# Patient Record
Sex: Female | Born: 1955 | Race: Black or African American | Hispanic: No | Marital: Married | Smoking: Former smoker
Health system: Southern US, Community
[De-identification: ages and names within clinical notes are randomized; demographics above are authoritative.]

---

## 1999-09-27 ENCOUNTER — Other Ambulatory Visit: Admission: RE | Admit: 1999-09-27 | Discharge: 1999-09-27 | Payer: Self-pay | Admitting: *Deleted

## 2000-11-09 ENCOUNTER — Ambulatory Visit (HOSPITAL_COMMUNITY): Admission: RE | Admit: 2000-11-09 | Discharge: 2000-11-09 | Payer: Self-pay | Admitting: Obstetrics and Gynecology

## 2000-11-09 ENCOUNTER — Encounter: Payer: Self-pay | Admitting: Obstetrics and Gynecology

## 2002-01-19 ENCOUNTER — Other Ambulatory Visit: Admission: RE | Admit: 2002-01-19 | Discharge: 2002-01-19 | Payer: Self-pay | Admitting: Obstetrics and Gynecology

## 2003-03-31 ENCOUNTER — Other Ambulatory Visit: Admission: RE | Admit: 2003-03-31 | Discharge: 2003-03-31 | Payer: Self-pay | Admitting: Obstetrics and Gynecology

## 2004-12-05 ENCOUNTER — Ambulatory Visit (HOSPITAL_COMMUNITY): Admission: RE | Admit: 2004-12-05 | Discharge: 2004-12-05 | Payer: Self-pay | Admitting: Obstetrics and Gynecology

## 2009-10-04 ENCOUNTER — Ambulatory Visit (HOSPITAL_COMMUNITY): Admission: RE | Admit: 2009-10-04 | Discharge: 2009-10-04 | Payer: Self-pay | Admitting: Obstetrics and Gynecology

## 2010-04-01 ENCOUNTER — Encounter (INDEPENDENT_AMBULATORY_CARE_PROVIDER_SITE_OTHER): Payer: Self-pay | Admitting: *Deleted

## 2010-09-24 NOTE — Letter (Signed)
Summary: New Patient letter  [facility]  [address] [place], [place] [address]   Phone: [phone]  Fax: [phone]       04/01/2010 MRN: [id number]  Alexandria Buchanan [address] [place], [place]  [address]  Dear Alexandria Buchanan,  Welcome to the Gastroenterology Division at [facility].    You are scheduled to see Dr.  Juanda Chance on 06-18-10 at 2:45p.m. on the 3rd floor at [facility], [address].  We ask that you try to arrive at our office 15 minutes prior to your appointment time to allow for check-in.  We would like you to complete the enclosed self-administered evaluation form prior to your visit and bring it with you on the day of your appointment.  We will review it with you.  Also, please bring a complete list of all your medications or, if you prefer, bring the medication bottles and we will list them.  Please bring your insurance card so that we may make a copy of it.  If your insurance requires a referral to see a specialist, please bring your referral form from your primary care physician.  Co-payments are due at the time of your visit and may be paid by cash, check or credit card.     Your office visit will consist of a consult with your physician (includes a physical exam), any laboratory testing he/she may order, scheduling of any necessary diagnostic testing (e.g. x-ray, ultrasound, CT-scan), and scheduling of a procedure (e.g. Endoscopy, Colonoscopy) if required.  Please allow enough time on your schedule to allow for any/all of these possibilities.    If you cannot keep your appointment, please call [phone] to cancel or reschedule prior to your appointment date.  This allows [place] the opportunity to schedule an appointment for another patient in need of care.  If you do not cancel or reschedule by 5 p.m. the business day prior to your appointment date, you will be charged a $50.00 late cancellation/no-show fee.    Thank you for choosing  [facility] for your medical needs.  We appreciate the opportunity to care for you.  Please visit [place] at our website  to learn more about our practice.                     Sincerely,                                                             The Gastroenterology Division

## 2010-11-13 ENCOUNTER — Other Ambulatory Visit (HOSPITAL_COMMUNITY): Payer: Self-pay | Admitting: Obstetrics and Gynecology

## 2010-11-13 DIAGNOSIS — Z1231 Encounter for screening mammogram for malignant neoplasm of breast: Secondary | ICD-10-CM

## 2010-11-15 ENCOUNTER — Ambulatory Visit (HOSPITAL_COMMUNITY)
Admission: RE | Admit: 2010-11-15 | Discharge: 2010-11-15 | Disposition: A | Discharge status: Home / Self Care | Payer: 59 | Source: Ambulatory Visit | Attending: Obstetrics and Gynecology | Admitting: Obstetrics and Gynecology

## 2010-11-15 DIAGNOSIS — Z1231 Encounter for screening mammogram for malignant neoplasm of breast: Secondary | ICD-10-CM | POA: Insufficient documentation

## 2012-01-08 ENCOUNTER — Other Ambulatory Visit (HOSPITAL_COMMUNITY): Payer: Self-pay | Admitting: Obstetrics and Gynecology

## 2012-01-08 ENCOUNTER — Other Ambulatory Visit (HOSPITAL_COMMUNITY): Payer: Self-pay | Admitting: *Deleted

## 2012-01-08 DIAGNOSIS — Z1231 Encounter for screening mammogram for malignant neoplasm of breast: Secondary | ICD-10-CM

## 2012-02-05 ENCOUNTER — Ambulatory Visit (HOSPITAL_COMMUNITY)
Admission: RE | Admit: 2012-02-05 | Discharge: 2012-02-05 | Disposition: A | Discharge status: Home / Self Care | Payer: Self-pay | Source: Ambulatory Visit | Attending: *Deleted | Admitting: *Deleted

## 2012-02-05 DIAGNOSIS — Z1231 Encounter for screening mammogram for malignant neoplasm of breast: Secondary | ICD-10-CM

## 2013-02-07 ENCOUNTER — Other Ambulatory Visit (HOSPITAL_COMMUNITY): Payer: Self-pay | Admitting: *Deleted

## 2013-02-07 DIAGNOSIS — Z1231 Encounter for screening mammogram for malignant neoplasm of breast: Secondary | ICD-10-CM

## 2013-02-16 ENCOUNTER — Ambulatory Visit (HOSPITAL_COMMUNITY)
Admission: RE | Admit: 2013-02-16 | Discharge: 2013-02-16 | Disposition: A | Discharge status: Home / Self Care | Payer: BC Managed Care – PPO | Source: Ambulatory Visit | Attending: *Deleted | Admitting: *Deleted

## 2013-02-16 DIAGNOSIS — Z1231 Encounter for screening mammogram for malignant neoplasm of breast: Secondary | ICD-10-CM | POA: Insufficient documentation

## 2014-01-31 ENCOUNTER — Other Ambulatory Visit (HOSPITAL_COMMUNITY): Payer: Self-pay | Admitting: Physician Assistant

## 2014-01-31 DIAGNOSIS — Z1231 Encounter for screening mammogram for malignant neoplasm of breast: Secondary | ICD-10-CM

## 2014-02-20 ENCOUNTER — Ambulatory Visit (HOSPITAL_COMMUNITY): Payer: 59

## 2014-02-23 ENCOUNTER — Ambulatory Visit (HOSPITAL_COMMUNITY)
Admission: RE | Admit: 2014-02-23 | Discharge: 2014-02-23 | Disposition: A | Discharge status: Home / Self Care | Payer: No Typology Code available for payment source | Source: Ambulatory Visit | Attending: Physician Assistant | Admitting: Physician Assistant

## 2014-02-23 DIAGNOSIS — Z1231 Encounter for screening mammogram for malignant neoplasm of breast: Secondary | ICD-10-CM | POA: Insufficient documentation

## 2015-02-27 ENCOUNTER — Other Ambulatory Visit (HOSPITAL_COMMUNITY): Payer: Self-pay | Admitting: Emergency Medicine

## 2015-02-27 DIAGNOSIS — Z1231 Encounter for screening mammogram for malignant neoplasm of breast: Secondary | ICD-10-CM

## 2015-03-23 ENCOUNTER — Ambulatory Visit (HOSPITAL_COMMUNITY)
Admission: RE | Admit: 2015-03-23 | Discharge: 2015-03-23 | Disposition: A | Discharge status: Home / Self Care | Payer: 59 | Source: Ambulatory Visit | Attending: Emergency Medicine | Admitting: Emergency Medicine

## 2015-03-23 DIAGNOSIS — Z1231 Encounter for screening mammogram for malignant neoplasm of breast: Secondary | ICD-10-CM | POA: Diagnosis not present

## 2016-03-20 ENCOUNTER — Other Ambulatory Visit: Payer: Self-pay | Admitting: Emergency Medicine

## 2016-03-20 DIAGNOSIS — Z1231 Encounter for screening mammogram for malignant neoplasm of breast: Secondary | ICD-10-CM

## 2016-04-11 ENCOUNTER — Ambulatory Visit
Admission: RE | Admit: 2016-04-11 | Discharge: 2016-04-11 | Disposition: A | Discharge status: Home / Self Care | Payer: BLUE CROSS/BLUE SHIELD | Source: Ambulatory Visit | Attending: Emergency Medicine | Admitting: Emergency Medicine

## 2016-04-11 DIAGNOSIS — Z1231 Encounter for screening mammogram for malignant neoplasm of breast: Secondary | ICD-10-CM

## 2017-03-10 ENCOUNTER — Other Ambulatory Visit: Payer: Self-pay | Admitting: Gastroenterology

## 2017-03-10 DIAGNOSIS — R1011 Right upper quadrant pain: Secondary | ICD-10-CM

## 2017-03-10 NOTE — Progress Notes (Signed)
Alexandria Boyde MD 

## 2017-03-24 ENCOUNTER — Ambulatory Visit (HOSPITAL_COMMUNITY)
Admission: RE | Admit: 2017-03-24 | Discharge: 2017-03-24 | Disposition: A | Discharge status: Home / Self Care | Payer: BLUE CROSS/BLUE SHIELD | Source: Ambulatory Visit

## 2017-03-24 ENCOUNTER — Ambulatory Visit (HOSPITAL_COMMUNITY)
Admission: RE | Admit: 2017-03-24 | Discharge: 2017-03-24 | Disposition: A | Discharge status: Home / Self Care | Payer: BLUE CROSS/BLUE SHIELD | Source: Ambulatory Visit | Attending: Gastroenterology | Admitting: Gastroenterology

## 2017-03-24 ENCOUNTER — Encounter (HOSPITAL_COMMUNITY): Payer: Self-pay

## 2017-03-24 ENCOUNTER — Encounter (HOSPITAL_COMMUNITY)
Admission: RE | Admit: 2017-03-24 | Discharge: 2017-03-24 | Disposition: A | Discharge status: Home / Self Care | Payer: BLUE CROSS/BLUE SHIELD | Source: Ambulatory Visit | Attending: Gastroenterology | Admitting: Gastroenterology

## 2017-03-24 DIAGNOSIS — R1011 Right upper quadrant pain: Secondary | ICD-10-CM

## 2017-03-24 MED ORDER — TECHNETIUM TC 99M MEBROFENIN IV KIT
5.0000 | PACK | Freq: Once | INTRAVENOUS | Status: AC | PRN
  Administered 2017-03-24: 5 via INTRAVENOUS

## 2017-05-26 ENCOUNTER — Other Ambulatory Visit: Payer: Self-pay | Admitting: Emergency Medicine

## 2017-05-26 DIAGNOSIS — Z1231 Encounter for screening mammogram for malignant neoplasm of breast: Secondary | ICD-10-CM

## 2017-06-12 ENCOUNTER — Ambulatory Visit
Admission: RE | Admit: 2017-06-12 | Discharge: 2017-06-12 | Disposition: A | Discharge status: Home / Self Care | Payer: BLUE CROSS/BLUE SHIELD | Source: Ambulatory Visit | Attending: Emergency Medicine | Admitting: Emergency Medicine

## 2017-06-12 DIAGNOSIS — Z1231 Encounter for screening mammogram for malignant neoplasm of breast: Secondary | ICD-10-CM

## 2017-06-15 ENCOUNTER — Other Ambulatory Visit: Payer: Self-pay | Admitting: Emergency Medicine

## 2017-06-15 DIAGNOSIS — N6320 Unspecified lump in the left breast, unspecified quadrant: Secondary | ICD-10-CM

## 2017-06-15 DIAGNOSIS — R928 Other abnormal and inconclusive findings on diagnostic imaging of breast: Secondary | ICD-10-CM

## 2017-06-18 ENCOUNTER — Other Ambulatory Visit: Payer: BLUE CROSS/BLUE SHIELD

## 2017-06-26 ENCOUNTER — Ambulatory Visit: Payer: BLUE CROSS/BLUE SHIELD

## 2017-06-26 ENCOUNTER — Ambulatory Visit
Admission: RE | Admit: 2017-06-26 | Discharge: 2017-06-26 | Disposition: A | Discharge status: Home / Self Care | Payer: BLUE CROSS/BLUE SHIELD | Source: Ambulatory Visit | Attending: Emergency Medicine | Admitting: Emergency Medicine

## 2017-06-26 DIAGNOSIS — N6320 Unspecified lump in the left breast, unspecified quadrant: Secondary | ICD-10-CM

## 2018-06-19 IMAGING — MG 2D DIGITAL DIAGNOSTIC UNILATERAL LEFT MAMMOGRAM WITH CAD AND ADJ
6 series · 6 of 14 positions shown · non-contrast
Comparison: Previous exam(s).

CLINICAL DATA: Patient returns today to evaluate a possible left
breast mass identified on recent screening mammogram.

EXAM:
2D DIGITAL DIAGNOSTIC UNILATERAL LEFT MAMMOGRAM WITH CAD AND ADJUNCT
TOMO

[L MLO synth-2D (1 of 2)]
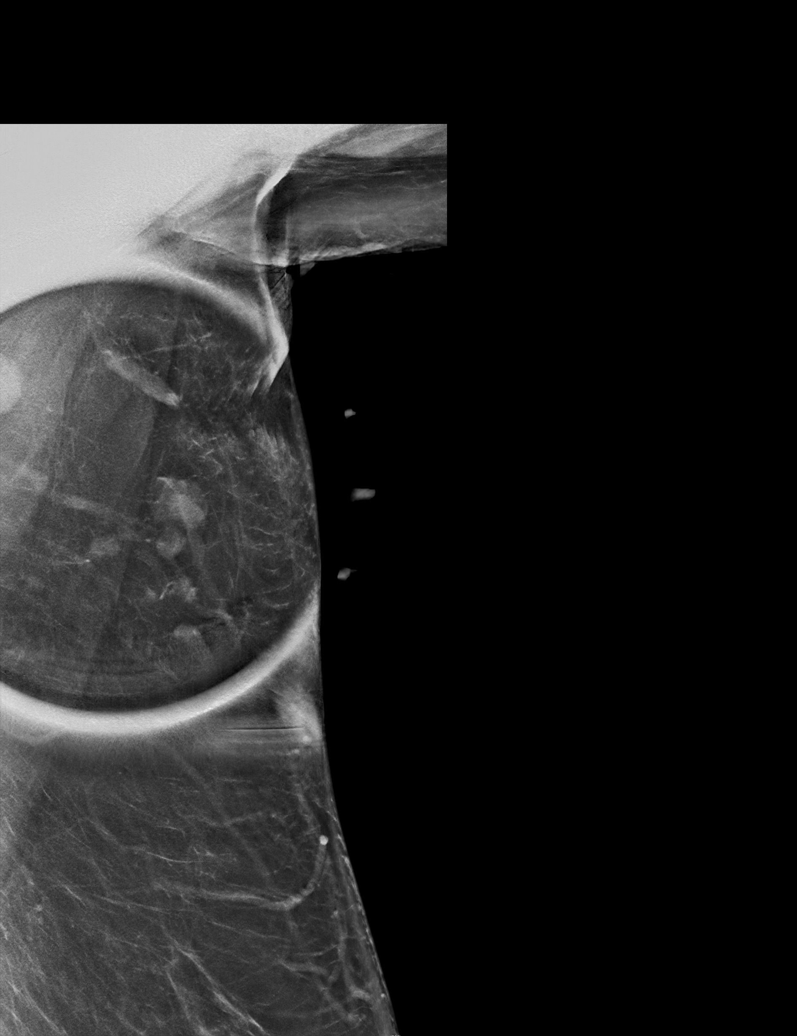

[L MLO (1 of 2)]
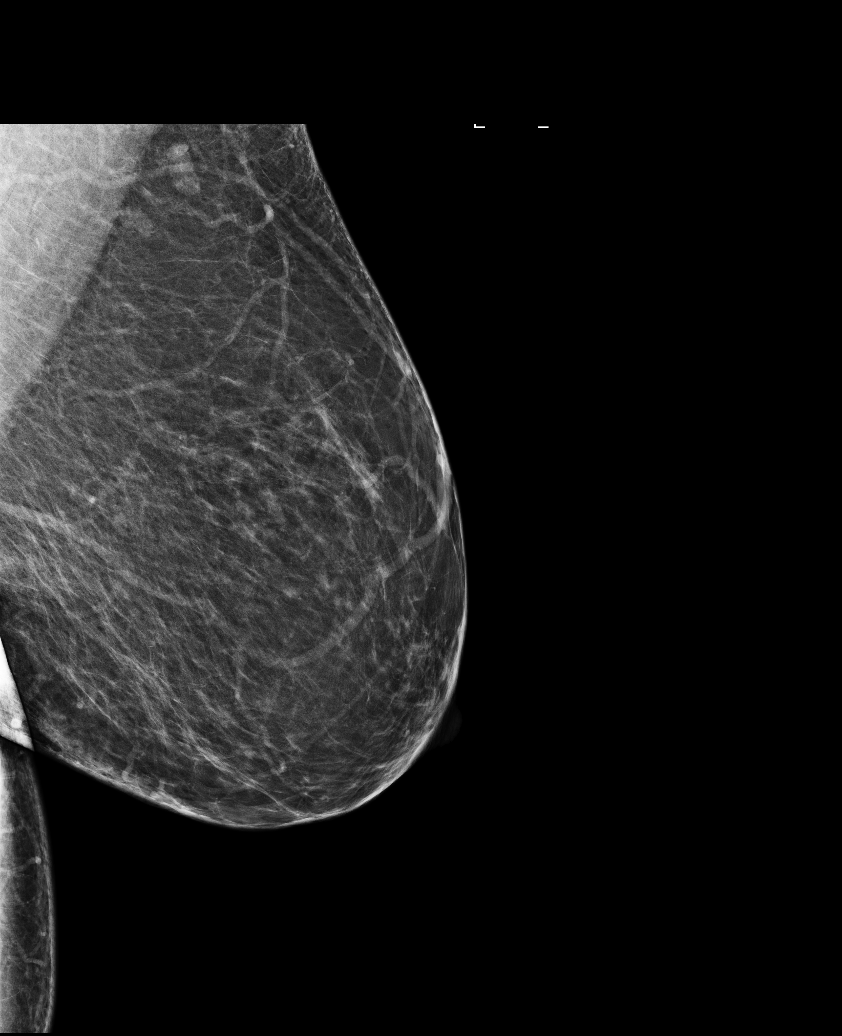

[L MLO (2 of 2)]
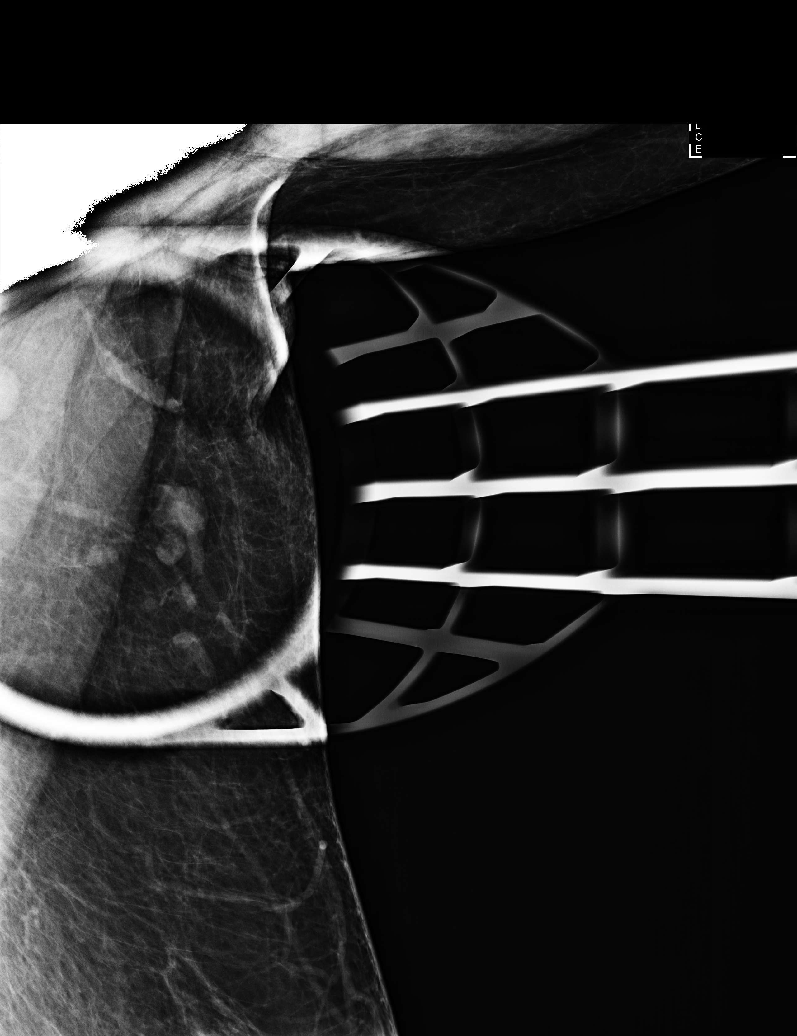

[L MLO synth-2D (2 of 2)]
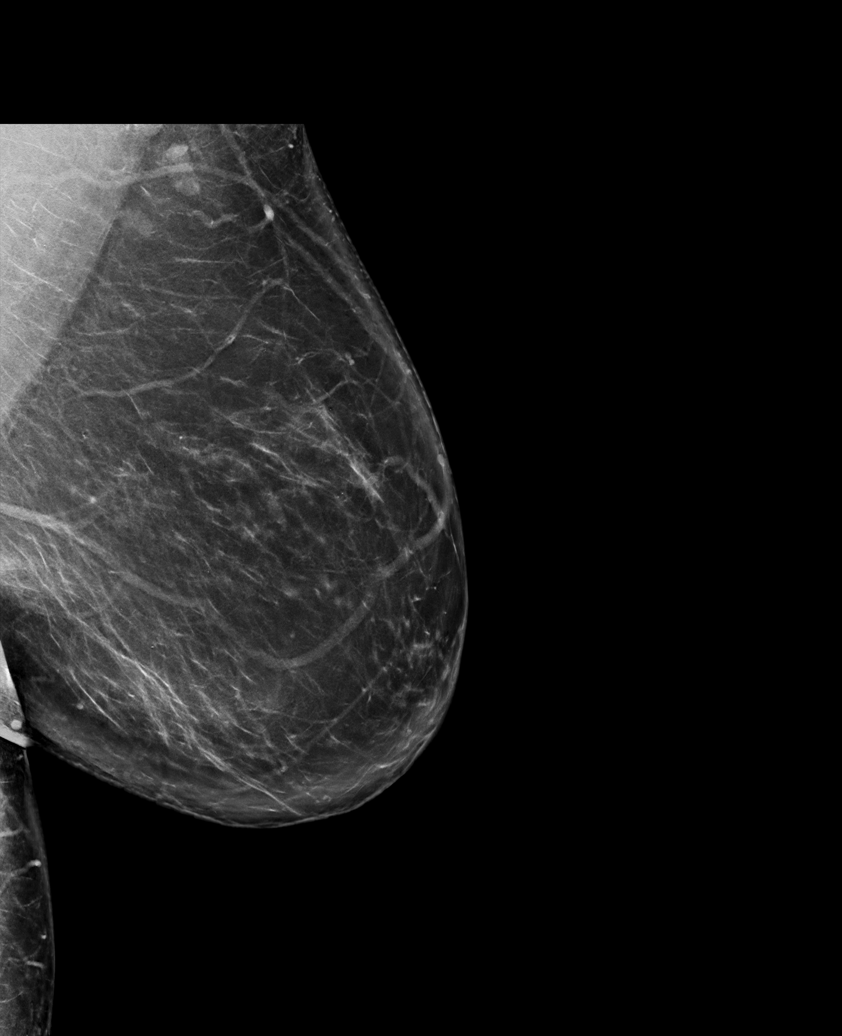

[L MLO tomo (1 of 2) · tomo slice 47/94.0]
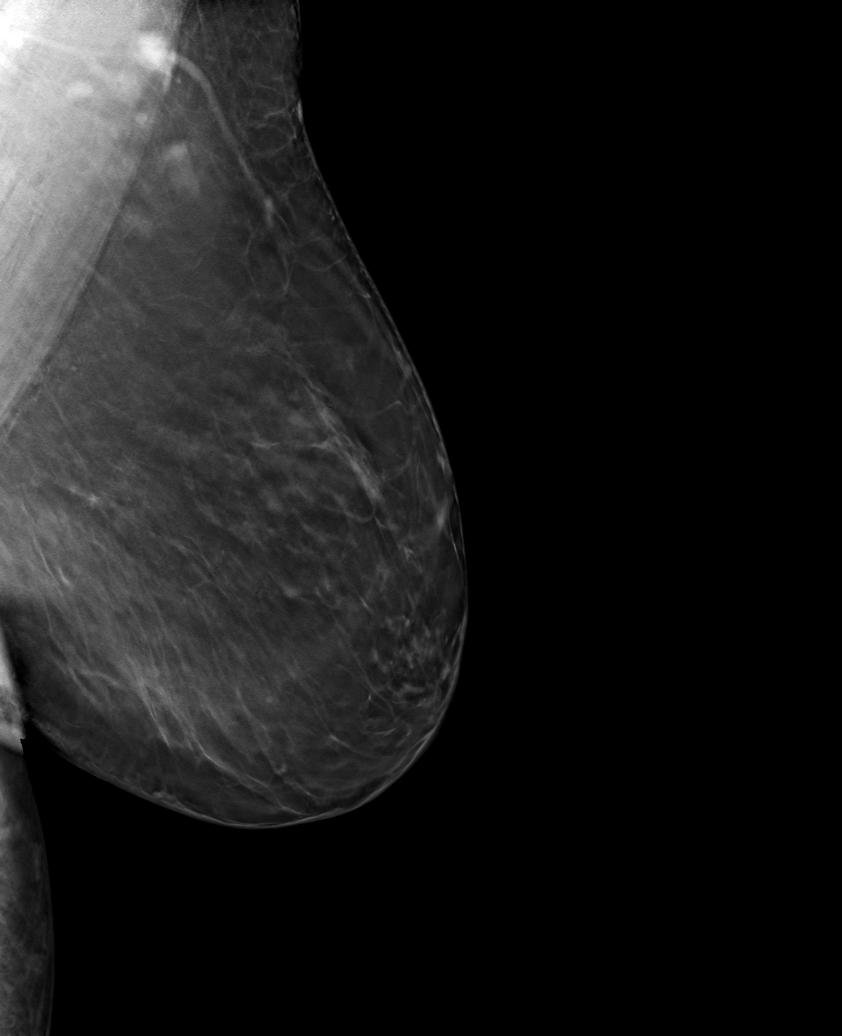

[L MLO tomo (2 of 2) · tomo slice 47/93.0]
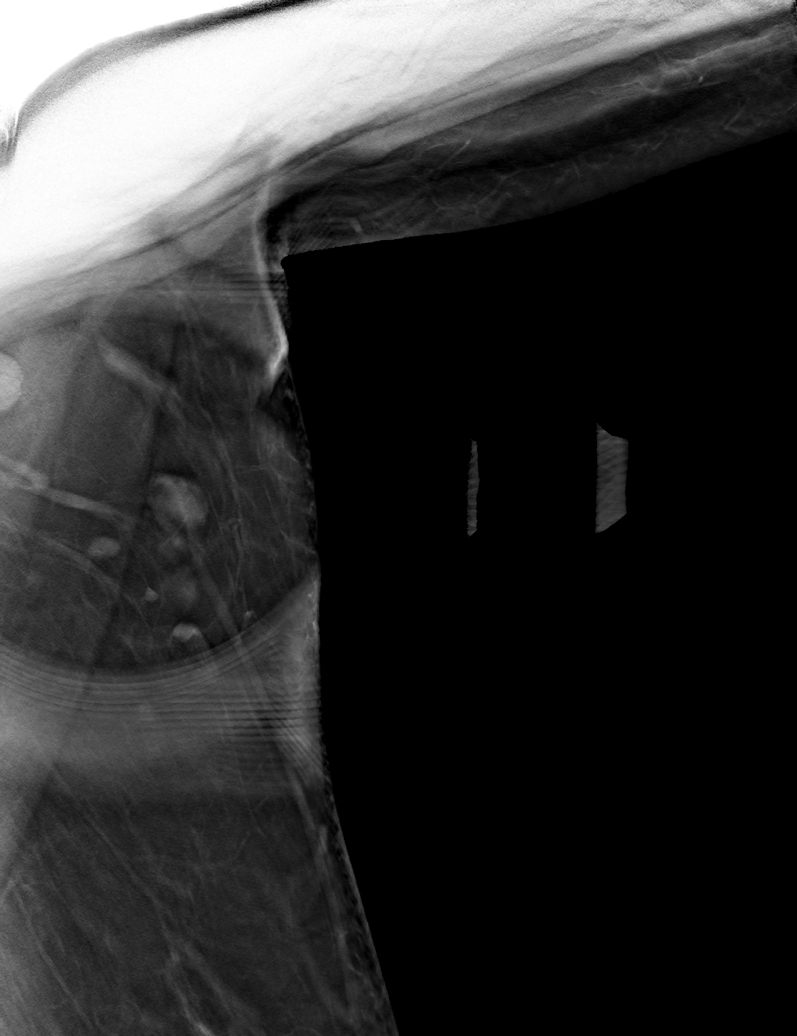

[6 of 14 positions shown; findings below may reference images not displayed]

ACR Breast Density Category b: There are scattered areas of
fibroglandular density.
FINDINGS: On today's additional views with spot compression and 3D
tomosynthesis, the questioned mass in the left axilla is consistent
with a benign lymph node, stable in size/configuration on today's
study compared to earlier exams dating back to 8488 indicating
benignity.

Mammographic images were processed with CAD.
IMPRESSION: No evidence of malignancy. Stable normal-appearing lymph node in the
left axilla.

Patient may return to routine annual bilateral screening mammogram
schedule.

RECOMMENDATION:
Screening mammogram in one year.(Code:DN-5-7DE)

I have discussed the findings and recommendations with the patient.
Results were also provided in writing at the conclusion of the
visit. If applicable, a reminder letter will be sent to the patient
regarding the next appointment.

BI-RADS CATEGORY  1: Negative.

## 2018-09-07 ENCOUNTER — Other Ambulatory Visit (HOSPITAL_COMMUNITY): Payer: Self-pay | Admitting: *Deleted

## 2018-09-07 DIAGNOSIS — Z1231 Encounter for screening mammogram for malignant neoplasm of breast: Secondary | ICD-10-CM

## 2018-11-09 ENCOUNTER — Ambulatory Visit (HOSPITAL_COMMUNITY): Payer: BLUE CROSS/BLUE SHIELD

## 2020-01-20 ENCOUNTER — Ambulatory Visit: Payer: Self-pay | Admitting: Allergy & Immunology

## 2021-10-01 DIAGNOSIS — I10 Essential (primary) hypertension: Secondary | ICD-10-CM | POA: Diagnosis not present

## 2021-10-01 DIAGNOSIS — E559 Vitamin D deficiency, unspecified: Secondary | ICD-10-CM | POA: Diagnosis not present

## 2021-10-01 DIAGNOSIS — Z79899 Other long term (current) drug therapy: Secondary | ICD-10-CM | POA: Diagnosis not present

## 2021-10-01 DIAGNOSIS — E785 Hyperlipidemia, unspecified: Secondary | ICD-10-CM | POA: Diagnosis not present

## 2021-12-03 ENCOUNTER — Ambulatory Visit (INDEPENDENT_AMBULATORY_CARE_PROVIDER_SITE_OTHER): Payer: Medicare HMO

## 2021-12-03 ENCOUNTER — Ambulatory Visit: Payer: Medicare HMO | Admitting: Orthopaedic Surgery

## 2021-12-03 ENCOUNTER — Encounter: Payer: Self-pay | Admitting: Orthopaedic Surgery

## 2021-12-03 VITALS — BP 170/77 | HR 74 | Ht 62.0 in | Wt 165.0 lb

## 2021-12-03 DIAGNOSIS — M25562 Pain in left knee: Secondary | ICD-10-CM | POA: Diagnosis not present

## 2021-12-03 DIAGNOSIS — G8929 Other chronic pain: Secondary | ICD-10-CM

## 2021-12-03 DIAGNOSIS — M65331 Trigger finger, right middle finger: Secondary | ICD-10-CM

## 2021-12-03 NOTE — Progress Notes (Signed)
? ?Office Visit Note ?  ?Patient: Alexandria Buchanan           ?Date of Birth: 09/08/1955           ?MRN: [id number] ?Visit Date: 12/03/2021 ?             ?Requested by: Marva Panda, NP ?[address] ?[place],  [place] [address] ?PCP: Marva Panda, NP ? ? ?Assessment & Plan: ?Visit Diagnoses:  ?1. Chronic pain of left knee   ?2. Trigger finger, right middle finger   ? ? ?Plan: Trigger finger injection performed pathophysiology discussed.  She can return if she has increasing knee symptoms.  Knee x-rays demonstrate no significant knee osteoarthritis.  If she develops locking increased swelling or giving way she can return. ? ?Follow-Up Instructions: Return today (on 12/03/2021).  ? ?Orders:  ?Orders Placed This Encounter  ?Procedures  ? Hand/UE Inj: L long A1  ? XR KNEE 3 VIEW LEFT  ? ?No orders of the defined types were placed in this encounter. ? ? ? ? Procedures: ?Hand/UE Inj: L long A1 for trigger finger on 12/03/2021 9:51 AM ?Details: 25 G needle, volar approach ?Medications: 1 mL lidocaine 1 %; 0.5 mL bupivacaine 0.25 %; 20 mg methylPREDNISolone acetate 40 MG/ML ? ? ? ? ?Clinical Data: ?No additional findings. ? ? ?Subjective: ?No chief complaint on file. ? ? ?HPI 66 year old female seen with left knee pain present off and on for 2 years.  Recently has been more constant sometimes she describes it as burning.  No locking or popping no giving way.  She heard noise with rotation at 1 period of time with her knee but did not notice any swelling.  She has noticed some prominence adjacent the lateral hamstring when she extends her knee internally rotates her hip.  She is also had right middle finger locking x6 months. ? ?Review of Systems history of previous cervical fusion by me 1996 doing well.  All other systems noncontributory to HPI. ? ? ?Objective: ?Vital Signs: BP (!) 170/77   Pulse 74   Ht 5\' 2"  (1.575 m)   Wt 165 lb (74.8 kg)   BMI 30.18 kg/m?  ? ?Physical Exam ?Constitutional:   ?    Appearance: She is well-developed.  ?HENT:  ?   Head: Normocephalic.  ?   Right Ear: External ear normal.  ?   Left Ear: External ear normal. There is no impacted cerumen.  ?Eyes:  ?   Pupils: Pupils are equal, round, and reactive to light.  ?Neck:  ?   Thyroid: No thyromegaly.  ?   Trachea: No tracheal deviation.  ?Cardiovascular:  ?   Rate and Rhythm: Normal rate.  ?Pulmonary:  ?   Effort: Pulmonary effort is normal.  ?Abdominal:  ?   Palpations: Abdomen is soft.  ?Musculoskeletal:  ?   Cervical back: No rigidity.  ?Skin: ?   General: Skin is warm and dry.  ?Neurological:  ?   Mental Status: She is alert and oriented to person, place, and time.  ?Psychiatric:     ?   Behavior: Behavior normal.  ? ? ?Ortho Exam palpable tenderness over the A1 pulley right long finger with partial triggering.  With full flexion of all digits she is able to demonstrate triggering.  No tenderness over the other digits A1 pulley. ? ?Left knee may have a small Baker's cyst adjacent to the medial hamstring.  Area where she notices prominence is symmetrical and is more in the  lateral popliteal region.  Reflexes are normal logroll hips are normal. ? ?Specialty Comments:  ?No specialty comments available. ? ?Imaging: ?No results found. ? ? ?PMFS History: ?Patient Active Problem List  ? Diagnosis Date Noted  ? Trigger finger, right middle finger 12/06/2021  ? ?No past medical history on file.  ?No family history on file.  ?No past surgical history on file. ?Social History  ? ?Occupational History  ? Not on file  ?Tobacco Use  ? Smoking status: Former  ?  Types: Cigarettes  ? Smokeless tobacco: Never  ?Substance and Sexual Activity  ? Alcohol use: Not Currently  ? Drug use: Not on file  ? Sexual activity: Not on file  ? ? ? ? ? ? ?

## 2021-12-05 DIAGNOSIS — M810 Age-related osteoporosis without current pathological fracture: Secondary | ICD-10-CM | POA: Diagnosis not present

## 2021-12-05 DIAGNOSIS — Z0000 Encounter for general adult medical examination without abnormal findings: Secondary | ICD-10-CM | POA: Diagnosis not present

## 2021-12-06 DIAGNOSIS — M65331 Trigger finger, right middle finger: Secondary | ICD-10-CM | POA: Insufficient documentation

## 2021-12-06 MED ORDER — BUPIVACAINE HCL 0.25 % IJ SOLN
0.5000 mL | INTRAMUSCULAR | Status: AC | PRN
  Administered 2021-12-03: .5 mL

## 2021-12-06 MED ORDER — LIDOCAINE HCL 1 % IJ SOLN
1.0000 mL | INTRAMUSCULAR | Status: AC | PRN
  Administered 2021-12-03: 1 mL

## 2021-12-06 MED ORDER — METHYLPREDNISOLONE ACETATE 40 MG/ML IJ SUSP
20.0000 mg | INTRAMUSCULAR | Status: AC | PRN
  Administered 2021-12-03: 20 mg

## 2022-01-22 DIAGNOSIS — L80 Vitiligo: Secondary | ICD-10-CM | POA: Diagnosis not present

## 2022-01-22 DIAGNOSIS — L820 Inflamed seborrheic keratosis: Secondary | ICD-10-CM | POA: Diagnosis not present

## 2022-03-26 DIAGNOSIS — U071 COVID-19: Secondary | ICD-10-CM | POA: Diagnosis not present

## 2022-03-26 DIAGNOSIS — J029 Acute pharyngitis, unspecified: Secondary | ICD-10-CM | POA: Diagnosis not present

## 2022-08-06 ENCOUNTER — Ambulatory Visit: Payer: Medicare HMO | Admitting: Allergy

## 2022-08-08 DIAGNOSIS — H43393 Other vitreous opacities, bilateral: Secondary | ICD-10-CM | POA: Diagnosis not present

## 2022-08-08 DIAGNOSIS — H2513 Age-related nuclear cataract, bilateral: Secondary | ICD-10-CM | POA: Diagnosis not present

## 2022-08-08 DIAGNOSIS — H524 Presbyopia: Secondary | ICD-10-CM | POA: Diagnosis not present

## 2022-08-08 DIAGNOSIS — H5213 Myopia, bilateral: Secondary | ICD-10-CM | POA: Diagnosis not present

## 2022-10-09 DIAGNOSIS — Z01419 Encounter for gynecological examination (general) (routine) without abnormal findings: Secondary | ICD-10-CM | POA: Diagnosis not present

## 2022-10-09 DIAGNOSIS — Z6830 Body mass index (BMI) 30.0-30.9, adult: Secondary | ICD-10-CM | POA: Diagnosis not present

## 2022-10-09 DIAGNOSIS — Z124 Encounter for screening for malignant neoplasm of cervix: Secondary | ICD-10-CM | POA: Diagnosis not present

## 2022-10-09 DIAGNOSIS — Z01411 Encounter for gynecological examination (general) (routine) with abnormal findings: Secondary | ICD-10-CM | POA: Diagnosis not present

## 2022-10-09 DIAGNOSIS — Z1231 Encounter for screening mammogram for malignant neoplasm of breast: Secondary | ICD-10-CM | POA: Diagnosis not present

## 2022-12-17 DIAGNOSIS — Z0000 Encounter for general adult medical examination without abnormal findings: Secondary | ICD-10-CM | POA: Diagnosis not present

## 2022-12-17 DIAGNOSIS — E785 Hyperlipidemia, unspecified: Secondary | ICD-10-CM | POA: Diagnosis not present

## 2022-12-17 DIAGNOSIS — I10 Essential (primary) hypertension: Secondary | ICD-10-CM | POA: Diagnosis not present

## 2022-12-17 DIAGNOSIS — R7303 Prediabetes: Secondary | ICD-10-CM | POA: Diagnosis not present

## 2023-01-28 DIAGNOSIS — L259 Unspecified contact dermatitis, unspecified cause: Secondary | ICD-10-CM | POA: Diagnosis not present

## 2023-01-28 DIAGNOSIS — I10 Essential (primary) hypertension: Secondary | ICD-10-CM | POA: Diagnosis not present

## 2023-02-09 DIAGNOSIS — H6501 Acute serous otitis media, right ear: Secondary | ICD-10-CM | POA: Diagnosis not present

## 2023-02-09 DIAGNOSIS — I10 Essential (primary) hypertension: Secondary | ICD-10-CM | POA: Diagnosis not present

## 2023-02-17 DIAGNOSIS — E669 Obesity, unspecified: Secondary | ICD-10-CM | POA: Diagnosis not present

## 2023-02-17 DIAGNOSIS — K449 Diaphragmatic hernia without obstruction or gangrene: Secondary | ICD-10-CM | POA: Diagnosis not present

## 2023-02-17 DIAGNOSIS — K219 Gastro-esophageal reflux disease without esophagitis: Secondary | ICD-10-CM | POA: Diagnosis not present

## 2023-02-17 DIAGNOSIS — E782 Mixed hyperlipidemia: Secondary | ICD-10-CM | POA: Diagnosis not present

## 2023-02-17 DIAGNOSIS — K5730 Diverticulosis of large intestine without perforation or abscess without bleeding: Secondary | ICD-10-CM | POA: Diagnosis not present

## 2023-02-17 DIAGNOSIS — I10 Essential (primary) hypertension: Secondary | ICD-10-CM | POA: Diagnosis not present

## 2023-03-20 DIAGNOSIS — I10 Essential (primary) hypertension: Secondary | ICD-10-CM | POA: Diagnosis not present

## 2023-03-20 DIAGNOSIS — Z20822 Contact with and (suspected) exposure to covid-19: Secondary | ICD-10-CM | POA: Diagnosis not present

## 2023-03-25 DIAGNOSIS — L2089 Other atopic dermatitis: Secondary | ICD-10-CM | POA: Diagnosis not present

## 2023-03-25 DIAGNOSIS — J301 Allergic rhinitis due to pollen: Secondary | ICD-10-CM | POA: Diagnosis not present

## 2023-03-25 DIAGNOSIS — J3081 Allergic rhinitis due to animal (cat) (dog) hair and dander: Secondary | ICD-10-CM | POA: Diagnosis not present

## 2023-03-25 DIAGNOSIS — J3089 Other allergic rhinitis: Secondary | ICD-10-CM | POA: Diagnosis not present

## 2023-05-15 DIAGNOSIS — I10 Essential (primary) hypertension: Secondary | ICD-10-CM | POA: Diagnosis not present

## 2023-07-29 DIAGNOSIS — E785 Hyperlipidemia, unspecified: Secondary | ICD-10-CM | POA: Diagnosis not present

## 2023-07-29 DIAGNOSIS — I10 Essential (primary) hypertension: Secondary | ICD-10-CM | POA: Diagnosis not present

## 2023-07-29 DIAGNOSIS — L309 Dermatitis, unspecified: Secondary | ICD-10-CM | POA: Diagnosis not present

## 2023-07-29 DIAGNOSIS — M1380 Other specified arthritis, unspecified site: Secondary | ICD-10-CM | POA: Diagnosis not present

## 2023-08-07 DIAGNOSIS — E875 Hyperkalemia: Secondary | ICD-10-CM | POA: Diagnosis not present

## 2023-08-07 DIAGNOSIS — R748 Abnormal levels of other serum enzymes: Secondary | ICD-10-CM | POA: Diagnosis not present

## 2023-09-21 ENCOUNTER — Other Ambulatory Visit: Payer: Self-pay

## 2023-09-21 ENCOUNTER — Other Ambulatory Visit (HOSPITAL_COMMUNITY): Payer: Self-pay

## 2023-09-22 ENCOUNTER — Other Ambulatory Visit (HOSPITAL_COMMUNITY): Payer: Self-pay

## 2023-09-24 ENCOUNTER — Other Ambulatory Visit (HOSPITAL_COMMUNITY): Payer: Self-pay

## 2023-09-24 MED ORDER — CLOBETASOL PROPIONATE 0.05 % EX CREA: 1.0000 | TOPICAL_CREAM | Freq: Two times a day (BID) | CUTANEOUS | 1 refills | Status: AC

## 2023-09-24 MED ORDER — LACTULOSE 10 GM/15ML PO SOLN: 10.0000 g | Freq: Every day | ORAL | 1 refills | Status: AC | PRN

## 2023-09-24 MED ORDER — OLMESARTAN MEDOXOMIL-HCTZ 40-25 MG PO TABS: 1.0000 | ORAL_TABLET | Freq: Every day | ORAL | 1 refills | Status: AC

## 2023-09-24 MED ORDER — ROSUVASTATIN CALCIUM 20 MG PO TABS
20.0000 mg | ORAL_TABLET | Freq: Every day | ORAL | 1 refills | Status: DC
  Filled 2024-06-22: qty 90, 90d supply, fill #0

## 2023-09-24 MED ORDER — MELOXICAM 15 MG PO TABS: 15.0000 mg | ORAL_TABLET | Freq: Every day | ORAL | 1 refills | Status: AC

## 2023-09-24 MED ORDER — OLMESARTAN MEDOXOMIL-HCTZ 20-12.5 MG PO TABS
1.0000 | ORAL_TABLET | Freq: Every day | ORAL | 1 refills | Status: AC
  Filled 2024-05-11: qty 90, 90d supply, fill #0

## 2023-09-24 MED ORDER — TRIAMCINOLONE ACETONIDE 0.1 % EX CREA: 1.0000 | TOPICAL_CREAM | Freq: Three times a day (TID) | CUTANEOUS | 2 refills | Status: AC

## 2023-09-24 MED ORDER — OLMESARTAN MEDOXOMIL 40 MG PO TABS: 40.0000 mg | ORAL_TABLET | Freq: Every day | ORAL | 1 refills | Status: AC

## 2023-09-25 ENCOUNTER — Other Ambulatory Visit (HOSPITAL_COMMUNITY): Payer: Self-pay

## 2023-10-06 ENCOUNTER — Other Ambulatory Visit (HOSPITAL_COMMUNITY): Payer: Self-pay

## 2023-10-08 ENCOUNTER — Other Ambulatory Visit (HOSPITAL_COMMUNITY): Payer: Self-pay

## 2023-10-09 ENCOUNTER — Other Ambulatory Visit (HOSPITAL_COMMUNITY): Payer: Self-pay

## 2023-10-09 MED ORDER — PANTOPRAZOLE SODIUM 40 MG PO TBEC
40.0000 mg | DELAYED_RELEASE_TABLET | Freq: Every day | ORAL | 1 refills | Status: AC
  Filled 2023-10-09: qty 90, 90d supply, fill #0
  Filled 2024-01-20: qty 90, 90d supply, fill #1

## 2023-11-24 ENCOUNTER — Other Ambulatory Visit (HOSPITAL_COMMUNITY): Payer: Self-pay

## 2023-11-24 MED ORDER — METHOCARBAMOL 500 MG PO TABS
500.0000 mg | ORAL_TABLET | Freq: Three times a day (TID) | ORAL | 0 refills | Status: DC | PRN
  Filled 2023-11-24: qty 30, 5d supply, fill #0

## 2023-11-25 ENCOUNTER — Other Ambulatory Visit (HOSPITAL_COMMUNITY): Payer: Self-pay

## 2024-01-20 ENCOUNTER — Other Ambulatory Visit (HOSPITAL_COMMUNITY): Payer: Self-pay

## 2024-01-22 ENCOUNTER — Other Ambulatory Visit: Payer: Self-pay

## 2024-01-22 ENCOUNTER — Other Ambulatory Visit (HOSPITAL_COMMUNITY): Payer: Self-pay

## 2024-01-22 MED ORDER — METHOCARBAMOL 500 MG PO TABS
500.0000 mg | ORAL_TABLET | Freq: Three times a day (TID) | ORAL | 0 refills | Status: DC | PRN
  Filled 2024-01-22: qty 30, 5d supply, fill #0

## 2024-05-06 ENCOUNTER — Other Ambulatory Visit (HOSPITAL_COMMUNITY): Payer: Self-pay

## 2024-05-09 ENCOUNTER — Other Ambulatory Visit (HOSPITAL_COMMUNITY): Payer: Self-pay

## 2024-05-09 ENCOUNTER — Other Ambulatory Visit: Payer: Self-pay

## 2024-05-09 MED ORDER — PANTOPRAZOLE SODIUM 40 MG PO TBEC
40.0000 mg | DELAYED_RELEASE_TABLET | Freq: Every day | ORAL | 1 refills | Status: DC
  Filled 2024-05-09: qty 90, 90d supply, fill #0
  Filled 2024-08-03: qty 90, 90d supply, fill #1

## 2024-05-10 ENCOUNTER — Other Ambulatory Visit (HOSPITAL_COMMUNITY): Payer: Self-pay

## 2024-05-11 ENCOUNTER — Other Ambulatory Visit (HOSPITAL_COMMUNITY): Payer: Self-pay

## 2024-05-11 ENCOUNTER — Other Ambulatory Visit: Payer: Self-pay

## 2024-05-13 ENCOUNTER — Other Ambulatory Visit (HOSPITAL_COMMUNITY): Payer: Self-pay

## 2024-05-13 MED ORDER — METHOCARBAMOL 500 MG PO TABS
500.0000 mg | ORAL_TABLET | Freq: Three times a day (TID) | ORAL | 0 refills | Status: AC | PRN
  Filled 2024-05-13: qty 30, 5d supply, fill #0

## 2024-06-21 ENCOUNTER — Other Ambulatory Visit: Payer: Self-pay

## 2024-06-21 ENCOUNTER — Other Ambulatory Visit (HOSPITAL_COMMUNITY): Payer: Self-pay

## 2024-06-21 MED ORDER — CLOTRIMAZOLE-BETAMETHASONE 1-0.05 % EX CREA
TOPICAL_CREAM | CUTANEOUS | 0 refills | Status: AC
  Filled 2024-06-21: qty 30, 15d supply, fill #0

## 2024-06-22 ENCOUNTER — Other Ambulatory Visit: Payer: Self-pay

## 2024-06-22 ENCOUNTER — Other Ambulatory Visit (HOSPITAL_COMMUNITY): Payer: Self-pay

## 2024-07-05 ENCOUNTER — Other Ambulatory Visit: Payer: Self-pay

## 2024-07-05 ENCOUNTER — Other Ambulatory Visit (HOSPITAL_COMMUNITY): Payer: Self-pay

## 2024-07-05 MED ORDER — GOLYTELY 236 G PO SOLR
ORAL | 0 refills | Status: AC
  Filled 2024-07-05: qty 4000, 1d supply, fill #0

## 2024-08-03 ENCOUNTER — Other Ambulatory Visit (HOSPITAL_COMMUNITY): Payer: Self-pay

## 2024-08-03 ENCOUNTER — Other Ambulatory Visit: Payer: Self-pay

## 2024-08-04 ENCOUNTER — Other Ambulatory Visit (HOSPITAL_COMMUNITY): Payer: Self-pay

## 2024-08-11 ENCOUNTER — Other Ambulatory Visit (HOSPITAL_COMMUNITY): Payer: Self-pay

## 2024-08-11 MED ORDER — ROSUVASTATIN CALCIUM 20 MG PO TABS: 20.0000 mg | ORAL_TABLET | Freq: Every day | ORAL | 1 refills | Status: AC

## 2024-08-11 MED ORDER — OLMESARTAN MEDOXOMIL-HCTZ 20-12.5 MG PO TABS
1.0000 | ORAL_TABLET | Freq: Every day | ORAL | 1 refills | Status: AC
  Filled 2024-08-11: qty 90, 90d supply, fill #0

## 2024-08-11 MED ORDER — PANTOPRAZOLE SODIUM 40 MG PO TBEC
40.0000 mg | DELAYED_RELEASE_TABLET | Freq: Every day | ORAL | 1 refills | Status: AC
  Filled 2024-08-11: qty 90, 90d supply, fill #0

## 2024-09-06 ENCOUNTER — Other Ambulatory Visit: Payer: Self-pay

## 2024-09-12 ENCOUNTER — Other Ambulatory Visit (HOSPITAL_COMMUNITY): Payer: Self-pay

## 2024-09-13 ENCOUNTER — Other Ambulatory Visit: Payer: Self-pay

## 2024-09-13 ENCOUNTER — Other Ambulatory Visit (HOSPITAL_COMMUNITY): Payer: Self-pay
